# Patient Record
Sex: Male | Born: 1965 | Race: Black or African American | Hispanic: No | Marital: Single
Health system: Southern US, Community
[De-identification: ages and names within clinical notes are randomized; demographics above are authoritative.]

## PROBLEM LIST (undated history)

## (undated) ENCOUNTER — Emergency Department (HOSPITAL_COMMUNITY): Discharge status: Absent without leave | Payer: Self-pay

---

## 2010-12-25 ENCOUNTER — Observation Stay (HOSPITAL_COMMUNITY)
Admission: EM | Admit: 2010-12-25 | Discharge: 2010-12-26 | Disposition: A | Discharge status: Home / Self Care | Payer: Self-pay | Source: Ambulatory Visit

## 2010-12-25 DIAGNOSIS — S1190XA Unspecified open wound of unspecified part of neck, initial encounter: Principal | ICD-10-CM | POA: Insufficient documentation

## 2010-12-25 DIAGNOSIS — Y92838 Other recreation area as the place of occurrence of the external cause: Secondary | ICD-10-CM | POA: Insufficient documentation

## 2010-12-25 DIAGNOSIS — S21109A Unspecified open wound of unspecified front wall of thorax without penetration into thoracic cavity, initial encounter: Secondary | ICD-10-CM

## 2010-12-25 DIAGNOSIS — J90 Pleural effusion, not elsewhere classified: Secondary | ICD-10-CM | POA: Insufficient documentation

## 2010-12-25 DIAGNOSIS — Y9239 Other specified sports and athletic area as the place of occurrence of the external cause: Secondary | ICD-10-CM | POA: Insufficient documentation

## 2010-12-25 DIAGNOSIS — Y998 Other external cause status: Secondary | ICD-10-CM | POA: Insufficient documentation

## 2010-12-26 ENCOUNTER — Emergency Department (HOSPITAL_COMMUNITY): Payer: Self-pay

## 2010-12-26 ENCOUNTER — Inpatient Hospital Stay (HOSPITAL_COMMUNITY): Payer: Self-pay

## 2010-12-26 LAB — RAPID URINE DRUG SCREEN, HOSP PERFORMED
Amphetamines: NOT DETECTED
Benzodiazepines: NOT DETECTED
Cocaine: POSITIVE — AB

## 2010-12-26 LAB — POCT I-STAT, CHEM 8
BUN: 12 mg/dL (ref 6–23)
Calcium, Ion: 1.07 mmol/L — ABNORMAL LOW (ref 1.12–1.32)
Chloride: 106 meq/L (ref 96–112)
Creatinine, Ser: 1.3 mg/dL (ref 0.50–1.35)
Glucose, Bld: 99 mg/dL (ref 70–99)
Potassium: 3.4 meq/L — ABNORMAL LOW (ref 3.5–5.1)

## 2010-12-26 LAB — PROTIME-INR
INR: 1 (ref 0.00–1.49)
Prothrombin Time: 13.4 seconds (ref 11.6–15.2)

## 2010-12-26 LAB — COMPREHENSIVE METABOLIC PANEL
AST: 30 U/L (ref 0–37)
Albumin: 3.4 g/dL — ABNORMAL LOW (ref 3.5–5.2)
Alkaline Phosphatase: 58 U/L (ref 39–117)
BUN: 12 mg/dL (ref 6–23)
Chloride: 103 mEq/L (ref 96–112)
Creatinine, Ser: 1.06 mg/dL (ref 0.50–1.35)
Potassium: 3.2 mEq/L — ABNORMAL LOW (ref 3.5–5.1)
Total Bilirubin: 0.4 mg/dL (ref 0.3–1.2)
Total Protein: 6.8 g/dL (ref 6.0–8.3)

## 2010-12-26 LAB — ETHANOL: Alcohol, Ethyl (B): 144 mg/dL — ABNORMAL HIGH (ref 0–11)

## 2010-12-26 LAB — TYPE AND SCREEN: Unit division: 0

## 2010-12-26 LAB — CBC
HCT: 39 % (ref 39.0–52.0)
Hemoglobin: 13.9 g/dL (ref 13.0–17.0)
MCHC: 35.6 g/dL (ref 30.0–36.0)
WBC: 8.9 10*3/uL (ref 4.0–10.5)

## 2010-12-26 LAB — URINALYSIS, ROUTINE W REFLEX MICROSCOPIC
Bilirubin Urine: NEGATIVE
Glucose, UA: NEGATIVE mg/dL
Ketones, ur: NEGATIVE mg/dL
Protein, ur: NEGATIVE mg/dL
pH: 5.5 (ref 5.0–8.0)

## 2010-12-26 LAB — ABO/RH: ABO/RH(D): O POS

## 2010-12-26 LAB — LACTIC ACID, PLASMA: Lactic Acid, Venous: 6.9 mmol/L — ABNORMAL HIGH (ref 0.5–2.2)

## 2010-12-26 MED ORDER — IOHEXOL 350 MG/ML SOLN
50.0000 mL | Freq: Once | INTRAVENOUS | Status: AC | PRN
Start: 2010-12-26 — End: 2010-12-26
  Administered 2010-12-26: 50 mL via INTRAVENOUS

## 2010-12-26 MED ORDER — IOHEXOL 350 MG/ML SOLN
50.0000 mL | Freq: Once | INTRAVENOUS | Status: AC | PRN
  Administered 2010-12-26: 50 mL via INTRAVENOUS

## 2011-01-02 ENCOUNTER — Encounter (INDEPENDENT_AMBULATORY_CARE_PROVIDER_SITE_OTHER): Payer: Self-pay
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
## 2011-01-09 ENCOUNTER — Emergency Department (HOSPITAL_COMMUNITY)
Admission: EM | Admit: 2011-01-09 | Discharge: 2011-01-09 | Disposition: A | Discharge status: Home / Self Care | Payer: Self-pay | Attending: Emergency Medicine | Admitting: Emergency Medicine

## 2011-01-09 ENCOUNTER — Encounter (INDEPENDENT_AMBULATORY_CARE_PROVIDER_SITE_OTHER): Payer: Self-pay

## 2011-01-09 DIAGNOSIS — Z4802 Encounter for removal of sutures: Secondary | ICD-10-CM | POA: Insufficient documentation
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
## 2017-11-24 ENCOUNTER — Emergency Department (HOSPITAL_COMMUNITY)
Admission: EM | Admit: 2017-11-24 | Discharge: 2017-11-24 | Disposition: A | Discharge status: Home / Self Care | Payer: Self-pay | Attending: Emergency Medicine | Admitting: Emergency Medicine

## 2017-11-24 ENCOUNTER — Other Ambulatory Visit: Payer: Self-pay

## 2017-11-24 ENCOUNTER — Emergency Department (HOSPITAL_COMMUNITY): Payer: Self-pay

## 2017-11-24 ENCOUNTER — Encounter (HOSPITAL_COMMUNITY): Payer: Self-pay | Admitting: Emergency Medicine

## 2017-11-24 DIAGNOSIS — N451 Epididymitis: Secondary | ICD-10-CM | POA: Insufficient documentation

## 2017-11-24 DIAGNOSIS — Z79899 Other long term (current) drug therapy: Secondary | ICD-10-CM | POA: Insufficient documentation

## 2017-11-24 LAB — URINALYSIS, ROUTINE W REFLEX MICROSCOPIC
BACTERIA UA: NONE SEEN
Bilirubin Urine: NEGATIVE
Glucose, UA: NEGATIVE mg/dL
Hgb urine dipstick: NEGATIVE
Ketones, ur: NEGATIVE mg/dL
NITRITE: NEGATIVE
PH: 6 (ref 5.0–8.0)
PROTEIN: NEGATIVE mg/dL
SPECIFIC GRAVITY, URINE: 1.017 (ref 1.005–1.030)

## 2017-11-24 LAB — RAPID HIV SCREEN (HIV 1/2 AB+AG)
HIV 1/2 ANTIBODIES: NONREACTIVE
HIV-1 P24 ANTIGEN - HIV24: NONREACTIVE

## 2017-11-24 MED ORDER — CEFTRIAXONE SODIUM 250 MG IJ SOLR
250.0000 mg | Freq: Once | INTRAMUSCULAR | Status: AC
  Administered 2017-11-24: 250 mg via INTRAMUSCULAR
  Filled 2017-11-24: qty 250

## 2017-11-24 MED ORDER — DOXYCYCLINE HYCLATE 100 MG PO CAPS: 100.0000 mg | ORAL_CAPSULE | Freq: Two times a day (BID) | ORAL | 0 refills | Status: AC

## 2017-11-24 MED ORDER — HYDROCODONE-ACETAMINOPHEN 5-325 MG PO TABS: 1.0000 | ORAL_TABLET | Freq: Four times a day (QID) | ORAL | 0 refills | Status: AC | PRN

## 2017-11-24 MED ORDER — DOXYCYCLINE HYCLATE 100 MG PO TABS
100.0000 mg | ORAL_TABLET | Freq: Once | ORAL | Status: AC
  Administered 2017-11-24: 100 mg via ORAL
  Filled 2017-11-24: qty 1

## 2017-11-24 MED ORDER — OXYCODONE-ACETAMINOPHEN 5-325 MG PO TABS
1.0000 | ORAL_TABLET | Freq: Once | ORAL | Status: AC
  Administered 2017-11-24: 1 via ORAL
  Filled 2017-11-24: qty 1

## 2017-11-24 NOTE — ED Notes (Signed)
Pt aware UA needed. 

## 2017-11-24 NOTE — ED Notes (Signed)
Patient called for room placement x1 with no answer. 

## 2017-11-24 NOTE — ED Provider Notes (Signed)
[facility]-EMERGENCY DEPT Provider Note   CSN: [id number] Arrival date & time: 11/24/17  1203     History   Chief Complaint Chief Complaint  Patient presents with  . Testicle Pain    HPI Randy Miller is a 52 y.o. male.  HPI  Randy Miller is a 52 year old male with no significant past medical history who presents to the emergency department for evaluation of left testicular pain.  Patient reports that he recently had unprotected sex with a new male partner.  States that over the past 3 days he has had gradually worsening left testicular pain which is constant, severe and described as "sharp."  Pain is acutely worsened with palpation or with standing and walking.  He has tried taking Tylenol and Aleve at home without improvement.  States that he also has noticed some brown discharge with ejaculation.  States that his urine also appears brown.  Endorses increased urinary frequency and tactile fevers at home.  Denies testicular swelling, genital sore, painful bowel movements, abdominal pain, nausea/vomiting, diarrhea, flank pain, chest pain, shortness of breath, lightheadedness, syncope.  Denies anal intercourse.  History reviewed. No pertinent past medical history.  There are no active problems to display for this patient.   History reviewed. No pertinent surgical history.      Home Medications    Prior to Admission medications   Medication Sig Start Date End Date Taking? Authorizing Provider  acetaminophen (TYLENOL) 500 MG tablet Take 1,000 mg by mouth daily as needed for moderate pain.   Yes [provider]  esomeprazole (NEXIUM) 20 MG capsule Take 20 mg by mouth daily at 12 noon.   Yes [provider]  ibuprofen (ADVIL,MOTRIN) 200 MG tablet Take 400 mg by mouth daily as needed for moderate pain.   Yes [provider]    Family History No family history on file.  Social History Social History   Tobacco Use  . Smoking status:  Not on file  Substance Use Topics  . Alcohol use: Yes    Alcohol/week: 15.0 oz    Types: 25 Cans of beer per week  . Drug use: Yes    Frequency: 7.0 times per week    Types: Cocaine     Allergies   Patient has no known allergies.   Review of Systems Review of Systems  Constitutional: Positive for fever.  HENT: Negative for congestion.   Respiratory: Negative for shortness of breath.   Cardiovascular: Negative for chest pain.  Gastrointestinal: Negative for abdominal pain, nausea and vomiting.  Genitourinary: Positive for frequency and testicular pain. Negative for difficulty urinating, discharge, dysuria, flank pain, genital sores, hematuria and scrotal swelling.  Musculoskeletal: Negative for back pain.  Skin: Negative for color change.  Neurological: Negative for syncope and light-headedness.  Psychiatric/Behavioral: Negative for agitation.     Physical Exam Updated Vital Signs BP (!) 171/106   Pulse 89   Temp 98.5 F (36.9 C) (Oral)   Resp 14   SpO2 96%   Physical Exam  Constitutional: He is oriented to person, place, and time. He appears well-developed and well-nourished. No distress.  Nontoxic-appearing.  HENT:  Head: Normocephalic and atraumatic.  Mouth/Throat: Oropharynx is clear and moist. No oropharyngeal exudate.  Eyes: Pupils are equal, round, and reactive to light. Right eye exhibits no discharge. Left eye exhibits no discharge.  Neck: Normal range of motion. Neck supple.  Cardiovascular: Normal rate, regular rhythm and intact distal pulses.  Pulmonary/Chest: Effort normal and breath sounds normal.  No stridor. No respiratory distress. He has no wheezes. He has no rales.  Abdominal: Soft. Bowel sounds are normal. There is no tenderness. There is no guarding.  No CVA tenderness.  Genitourinary:  Genitourinary Comments: Chaperone present for exam. No discharge from penis. No signs of lesion or erythema on the penis or testicles. Exquisitely tender to  palpation over the left epididymis.  No appreciable testicular masses or swelling. No signs of any inguinal hernias. Cremaster reflex present bilaterally.  Musculoskeletal: Normal range of motion.  Neurological: He is alert and oriented to person, place, and time. Coordination normal.  Skin: Skin is warm and dry. He is not diaphoretic.  Psychiatric: He has a normal mood and affect. His behavior is normal.  Nursing note and vitals reviewed.    ED Treatments / Results  Labs (all labs ordered are listed, but only abnormal results are displayed) Labs Reviewed  URINALYSIS, ROUTINE W REFLEX MICROSCOPIC - Abnormal; Notable for the following components:      Result Value   Leukocytes, UA MODERATE (*)    All other components within normal limits  RAPID HIV SCREEN (HIV 1/2 AB+AG)  RPR  GC[facility]CHLAMYDIA PROBE AMP [facility]    EKG None  Radiology [place] Scrotum W/doppler  Result Date: 11/24/2017 CLINICAL DATA:  Left testicular pain for 3 days EXAM: SCROTAL ULTRASOUND DOPPLER ULTRASOUND OF THE TESTICLES TECHNIQUE: Complete ultrasound examination of the testicles, epididymis, and other scrotal structures was performed. Color and spectral Doppler ultrasound were also utilized to evaluate blood flow to the testicles. COMPARISON:  None. FINDINGS: Right testicle Measurements: 3.9 x 1.8 x 2.7 cm. No mass or microlithiasis visualized. Left testicle Measurements: 3.7 x 2.4 x 2.6 cm. No mass or microlithiasis visualized. Diffuse hypervascularity evident. Right epididymis:  Normal in size and appearance. Left epididymis: Mild diffuse enlargement and hypervascular. Tiny epididymal tail hypoechoic cyst noted measuring 3 mm. Hydrocele:  Small left hydrocele Varicocele:  No significant varicocele appreciated. Pulsed Doppler interrogation of both testes demonstrates normal low resistance arterial and venous waveforms bilaterally. IMPRESSION: Left testicle and epididymis hypervascularity compatible with acute  epididymo-orchitis and small reactive left hydrocele. Negative for torsion or testicular mass. Electronically Signed   By: Judie PetitM.  Shick M.D.   On: 11/24/2017 17:43    Procedures Procedures (including critical care time)  Medications Ordered in ED Medications  oxyCODONE-acetaminophen (PERCOCET/ROXICET) 5-325 MG per tablet 1 tablet (1 tablet Oral Given 11/24/17 1802)  cefTRIAXone (ROCEPHIN) injection 250 mg (250 mg Intramuscular Given 11/24/17 1802)  doxycycline (VIBRA-TABS) tablet 100 mg (100 mg Oral Given 11/24/17 1802)     Initial Impression / Assessment and Plan / ED Course  I have reviewed the triage vital signs and the nursing notes.  Pertinent labs & imaging results that were available during my care of the patient were reviewed by me and considered in my medical decision making (see chart for details).     Labs and imaging reviewed. UA infected.  Scrotal ultrasound reveals left epididymis hypervascularity compatible with acute epididymitis and small reactive left hydrocele.  No torsion.  This is most likely caused by sexually transmitted chlamydia or gonorrhea given patient reports recent unprotected sex with new male partner.  Treated with IM Rocephin and will be discharged with doxycycline twice daily x10 days.  Patient denies rectal pain or painful bowel movements, no concern for prostatitis.  I have counseled patient on use of NSAIDs for pain and will also discharge with short course of Norco.  He has been  advised to rest, ice and use scrotal support to help with pain.  Counseled him that he has GC/chlamydia, HIV and syphilis testing which is pending and he will need to inform all sexual partners if this comes back positive.  Have counseled him that his blood pressure is elevated and he will need to have this rechecked by regular doctor for further management.  Discussed reasons to return to the emergency department and he agrees.   Final Clinical Impressions(s) / ED Diagnoses   Final  diagnoses:  Epididymitis    ED Discharge Orders    None       Lawrence Marseilles 11/24/17 2311    Pricilla Loveless, MD 11/26/17 [phone]

## 2017-11-24 NOTE — Discharge Instructions (Addendum)
You have an infection in your testicle, likely due to STD. We are treating you with an antibiotic. Please take antibiotic twice a day for ten days. Complete the full course of antibiotic.   Take ibuprofen 600mg  for pain. I have also written a prescription for norco which is a narcotic pain medication as needed for break through pain. Use ice and elevate the testicles for support and pain relief.   You have chlamydia, gonorrhea, HIV and syphilis testing which is pending.  He will receive a phone call if this comes back positive.  If positive you will need to inform all sexual partners and let them know that they will need to be treated.  I have listed the information to the health department below.   Return to the ER if you have any new or concerning symptoms like worsening abdominal pain, vomiting that will not stop, fever greater than 100.4 F.

## 2017-11-24 NOTE — ED Triage Notes (Addendum)
Patient c/o left testicle pain x3 days. Denies urinary sx and swelling. Reports pain worsens with movement.

## 2017-11-24 NOTE — ED Notes (Signed)
Patient called for room placement x2 with no answer. 

## 2017-11-25 LAB — RPR: RPR: NONREACTIVE
